# Patient Record
Sex: Female | Born: 1960 | Race: Black or African American | Hispanic: No | Marital: Married | Smoking: Never smoker
Health system: Southern US, Community
[De-identification: ages and names within clinical notes are randomized; demographics above are authoritative.]

---

## 2016-04-23 ENCOUNTER — Ambulatory Visit (INDEPENDENT_AMBULATORY_CARE_PROVIDER_SITE_OTHER): Payer: BLUE CROSS/BLUE SHIELD | Admitting: Physician Assistant

## 2016-04-23 VITALS — BP 120/80 | HR 72 | Temp 97.9°F | Resp 16 | Ht 62.0 in | Wt 191.0 lb

## 2016-04-23 DIAGNOSIS — Z1329 Encounter for screening for other suspected endocrine disorder: Secondary | ICD-10-CM | POA: Diagnosis not present

## 2016-04-23 DIAGNOSIS — Z0000 Encounter for general adult medical examination without abnormal findings: Secondary | ICD-10-CM | POA: Diagnosis not present

## 2016-04-23 DIAGNOSIS — Z13220 Encounter for screening for lipoid disorders: Secondary | ICD-10-CM | POA: Diagnosis not present

## 2016-04-23 DIAGNOSIS — Z130 Encounter for screening for diseases of the blood and blood-forming organs and certain disorders involving the immune mechanism: Secondary | ICD-10-CM

## 2016-04-23 DIAGNOSIS — Z13228 Encounter for screening for other metabolic disorders: Secondary | ICD-10-CM | POA: Diagnosis not present

## 2016-04-23 LAB — COMPLETE METABOLIC PANEL WITH GFR
ALBUMIN: 3.7 g/dL (ref 3.6–5.1)
ALK PHOS: 96 U/L (ref 33–130)
ALT: 20 U/L (ref 6–29)
AST: 30 U/L (ref 10–35)
BILIRUBIN TOTAL: 0.3 mg/dL (ref 0.2–1.2)
BUN: 11 mg/dL (ref 7–25)
CO2: 22 mmol/L (ref 20–31)
CREATININE: 0.55 mg/dL (ref 0.50–1.05)
Calcium: 8.5 mg/dL — ABNORMAL LOW (ref 8.6–10.4)
Chloride: 106 mmol/L (ref 98–110)
GFR, Est African American: >89 mL/min (ref >=60)
GFR, Est Non African American: >89 mL/min (ref >=60)
GLUCOSE: 115 mg/dL — AB (ref 65–99)
Potassium: 4 mmol/L (ref 3.5–5.3)
SODIUM: 138 mmol/L (ref 135–146)
TOTAL PROTEIN: 6.4 g/dL (ref 6.1–8.1)

## 2016-04-23 LAB — CBC
HCT: 31.9 % — ABNORMAL LOW (ref 35.0–45.0)
HEMOGLOBIN: 10.4 g/dL — AB (ref 11.7–15.5)
MCH: 26.3 pg — AB (ref 27.0–33.0)
MCHC: 32.6 g/dL (ref 32.0–36.0)
MCV: 80.8 fL (ref 80.0–100.0)
MPV: 10.3 fL (ref 7.5–12.5)
Platelets: 273 10*3/uL (ref 140–400)
RBC: 3.95 MIL/uL (ref 3.80–5.10)
RDW: 14.6 % (ref 11.0–15.0)
WBC: 2.3 10*3/uL — ABNORMAL LOW (ref 3.8–10.8)

## 2016-04-23 LAB — LIPID PANEL
Cholesterol: 117 mg/dL — ABNORMAL LOW (ref 125–200)
HDL: 60 mg/dL (ref >=46)
LDL CALC: 43 mg/dL (ref <130)
Total CHOL/HDL Ratio: 2 Ratio (ref <=5.0)
Triglycerides: 72 mg/dL (ref <150)
VLDL: 14 mg/dL (ref <30)

## 2016-04-23 LAB — POCT URINALYSIS DIP (MANUAL ENTRY)
Bilirubin, UA: NEGATIVE
Glucose, UA: NEGATIVE
Ketones, POC UA: NEGATIVE
LEUKOCYTES UA: NEGATIVE
NITRITE UA: NEGATIVE
PH UA: 7
PROTEIN UA: NEGATIVE
Spec Grav, UA: 1.02
UROBILINOGEN UA: 0.2

## 2016-04-23 LAB — TSH: TSH: 1.4 m[IU]/L

## 2016-04-23 NOTE — Patient Instructions (Addendum)
Please increase your water intake to 64 oz if not more per day.   I will have your lab results within the next 2 weeks.   Keeping You Healthy  Get These Tests  Blood Pressure- Have your blood pressure checked by your healthcare provider at least once a year.  Normal blood pressure is 120/80.  Weight- Have your body mass index (BMI) calculated to screen for obesity.  BMI is a measure of body fat based on height and weight.  You can calculate your own BMI at https://www.west-esparza.com/  Cholesterol- Have your cholesterol checked every year.  Diabetes- Have your blood sugar checked every year if you have high blood pressure, high cholesterol, a family history of diabetes or if you are overweight.  Pap Test - Have a pap test every 1 to 5 years if you have been sexually active.  If you are older than 65 and recent pap tests have been normal you may not need additional pap tests.  In addition, if you have had a hysterectomy  for benign disease additional pap tests are not necessary.  Mammogram-Yearly mammograms are essential for early detection of breast cancer  Screening for Colon Cancer- Colonoscopy starting at age 71. Screening may begin sooner depending on your family history and other health conditions.  Follow up colonoscopy as directed by your Gastroenterologist.  Screening for Osteoporosis- Screening begins at age 76 with bone density scanning, sooner if you are at higher risk for developing Osteoporosis.  Get these medicines  Calcium with Vitamin D- Your body requires 1200-1500 mg of Calcium a day and [phone] IU of Vitamin D a day.  You can only absorb 500 mg of Calcium at a time therefore Calcium must be taken in 2 or 3 separate doses throughout the day.  Hormones- Hormone therapy has been associated with increased risk for certain cancers and heart disease.  Talk to your healthcare provider about if you need relief from menopausal symptoms.  Aspirin- Ask your healthcare provider about  taking Aspirin to prevent Heart Disease and Stroke.  Get these Immuniztions  Flu shot- Every fall  Pneumonia shot- Once after the age of 71; if you are younger ask your healthcare provider if you need a pneumonia shot.  Tetanus- Every ten years.  Zostavax- Once after the age of 57 to prevent shingles.  Take these steps  Don't smoke- Your healthcare provider can help you quit. For tips on how to quit, ask your healthcare provider or go to www.smokefree.gov or call 1-800 QUIT-NOW.  Be physically active- Exercise 5 days a week for a minimum of 30 minutes.  If you are not already physically active, start slow and gradually work up to 30 minutes of moderate physical activity.  Try walking, dancing, bike riding, swimming, etc.  Eat a healthy diet- Eat a variety of healthy foods such as fruits, vegetables, whole grains, low fat milk, low fat cheeses, yogurt, lean meats, chicken, fish, eggs, dried beans, tofu, etc.  For more information go to www.thenutritionsource.org  Dental visit- Brush and floss teeth twice daily; visit your dentist twice a year.  Eye exam- Visit your Optometrist or Ophthalmologist yearly.  Drink alcohol in moderation- Limit alcohol intake to one drink or less a day.  Never drink and drive.  Depression- Your emotional health is as important as your physical health.  If you're feeling down or losing interest in things you normally enjoy, please talk to your healthcare provider.  Seat Belts- can save your life; always wear one  Smoke/Carbon Monoxide detectors- These detectors need to be installed on the appropriate level of your home.  Replace batteries at least once a year.  Violence- If anyone is threatening or hurting you, please tell your healthcare provider.  Living Will/ Health care power of attorney- Discuss with your healthcare provider and family.   IF you received an x-ray today, you will receive an invoice from [facility]. Please contact [facility] at [phone] with questions or concerns regarding your invoice.   IF you received labwork today, you will receive an invoice from [facility]. Please contact [facility] at [phone] with questions or concerns regarding your invoice.   Our billing staff will not be able to assist you with questions regarding bills from these companies.  You will be contacted with the lab results as soon as they are available. The fastest way to get your results is to activate your My Chart account. Instructions are located on the last page of this paperwork. If you have not heard from us regarding the results in 2 weeks, please contact this office.

## 2016-04-23 NOTE — Progress Notes (Signed)
[facility] [address], [place] [id number] [phone]  Date:  04/23/2016   Name:  Patty KinsmanKaren Armstrong   DOB:  Oct 04, 1960   MRN:  [id number]  PCP:  No PCP Per Patient    History of Present Illness:  Patty KinsmanKaren Cull is a 55 y.o. female patient who [facility].    Diet: vegetarian for 2 years--eating a lot of vegetables, tries to eat nuts.  Rarely fish.  Water intake is about twice per week.  Rare soda.  Drinks a lot of hot tea.   BM: no constipation or diarrhea.  No blood in stool or black stool  Urination: no pain, hematuria--she does have some urgency.    Sleep: No difficulty with sleep, she gets about 5-6 hours due to work.  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    EtOH: rare during special events Tobacco: tried once Illicit drug use: none  There are no active problems to display for this patient.   History reviewed. No pertinent past medical history.  History reviewed. No pertinent surgical history.  Social History  Substance Use Topics  . Smoking status: Never Smoker  . Smokeless tobacco: Never Used  . Alcohol use No    Family History  Problem Relation Age of Onset  . Diabetes Mother   . Heart disease Mother   . Hypertension Mother     No Known Allergies  Medication list has been reviewed and updated.  No current outpatient prescriptions on file prior to visit.   No current facility-administered medications on file prior to visit.     Review of Systems  Constitutional: Negative for chills and fever.  HENT: Negative for ear discharge, ear pain and sore throat.   Eyes: Negative for blurred vision and double vision.  Respiratory: Negative for cough, shortness of breath and wheezing.   Cardiovascular: Negative for chest pain, palpitations and leg swelling.  Gastrointestinal: Negative for diarrhea, nausea and vomiting.  Genitourinary: Negative for dysuria, frequency and hematuria.  Skin:  Negative for itching and rash.  Neurological: Negative for dizziness and headaches.   Physical Examination: BP 120/80 (BP Location: Right Arm, Patient Position: Sitting, Cuff Size: Normal)   Pulse 72   Temp 97.9 F (36.6 C) (Oral)   Resp 16   Ht 5\' 2"  (1.575 m)   Wt 191 lb (86.6 kg)   LMP 04/20/2016 (Approximate)   SpO2 100%   BMI 34.93 kg/m  Ideal Body Weight: Weight in (lb) to have BMI = 25: 136.4  Physical Exam  Constitutional: She is oriented to person, place, and time. She appears well-developed and well-nourished. No distress.  HENT:  Head: Normocephalic and atraumatic.  Right Ear: Tympanic membrane, external ear and ear canal normal.  Left Ear: Tympanic membrane, external ear and ear canal normal.  Nose: Right sinus exhibits no maxillary sinus tenderness and no frontal sinus tenderness. Left sinus exhibits no maxillary sinus tenderness and no frontal sinus tenderness.  Mouth/Throat: Oropharynx is clear and moist. No uvula swelling. No oropharyngeal exudate, posterior oropharyngeal edema or posterior oropharyngeal erythema.  Eyes: Conjunctivae and EOM are normal. Pupils are equal, round, and reactive to light.  Neck: Normal range of motion. Neck supple. No thyromegaly present.  Cardiovascular: Normal rate, regular rhythm, normal heart sounds and intact distal pulses.  Exam reveals no gallop, no distant heart sounds and no friction rub.   No murmur heard. Pulmonary/Chest: Effort normal and breath sounds  normal. No respiratory distress. She has no decreased breath sounds. She has no wheezes. She has no rhonchi.  Abdominal: Soft. Bowel sounds are normal. She exhibits no distension and no mass. There is no tenderness.  Musculoskeletal: Normal range of motion. She exhibits no edema or tenderness.  Lymphadenopathy:       Head (right side): No submandibular, no tonsillar, no preauricular and no posterior auricular adenopathy present.       Head (left side): No submandibular, no  tonsillar, no preauricular and no posterior auricular adenopathy present.    She has no cervical adenopathy.  Neurological: She is alert and oriented to person, place, and time. No cranial nerve deficit. She exhibits normal muscle tone. Coordination normal.  Skin: Skin is warm and dry. She is not diaphoretic.  Psychiatric: She has a normal mood and affect. Her behavior is normal.     Assessment and Plan: Patty Armstrong is a 54 y.o. female who is here today for annual physical exam. Annual physical exam - Plan: CBC, COMPLETE METABOLIC PANEL WITH GFR, Lipid panel, TSH, POCT urinalysis dipstick  Screening for deficiency anemia - Plan: CBC  Screening for lipid disorders - Plan: Lipid panel  Screening for thyroid disorder - Plan: TSH  Screening for metabolic disorder - Plan: COMPLETE METABOLIC PANEL WITH GFR, POCT urinalysis dipstick  Trena Platt, PA-C [facility] 04/23/2016 8:57 AM

## 2016-04-26 ENCOUNTER — Telehealth: Payer: Self-pay

## 2016-04-26 NOTE — Telephone Encounter (Signed)
Correct cell phone number is [phone]

## 2016-04-26 NOTE — Telephone Encounter (Signed)
Left message to call back, does she need a letter just stating she was here or something more detailed about the px?

## 2016-04-26 NOTE — Telephone Encounter (Addendum)
Patty Armstrong   Patient needs a letter stating she had a physical last week.    Patient will  Pick up the letter.    [phone]

## 2016-04-27 NOTE — Telephone Encounter (Signed)
Spoke with pt and she stated she came by today and picked up appropriate paperwork.

## 2016-04-27 NOTE — Telephone Encounter (Signed)
Patient called back stating that she needs a letter saying that she is in good health and cleared to work, as well as her vitals from her CPE.

## 2017-08-29 ENCOUNTER — Emergency Department
Admission: EM | Admit: 2017-08-29 | Discharge: 2017-08-29 | Disposition: A | Discharge status: Home / Self Care | Payer: BLUE CROSS/BLUE SHIELD | Attending: Emergency Medicine | Admitting: Emergency Medicine

## 2017-08-29 ENCOUNTER — Other Ambulatory Visit: Payer: Self-pay

## 2017-08-29 DIAGNOSIS — R51 Headache: Secondary | ICD-10-CM | POA: Diagnosis not present

## 2017-08-29 DIAGNOSIS — R519 Headache, unspecified: Secondary | ICD-10-CM

## 2017-08-29 MED ORDER — BUTALBITAL-APAP-CAFFEINE 50-325-40 MG PO TABS: 1.0000 | ORAL_TABLET | Freq: Four times a day (QID) | ORAL | 0 refills | Status: AC | PRN

## 2017-08-29 MED ORDER — BUTALBITAL-APAP-CAFFEINE 50-325-40 MG PO TABS
2.0000 | ORAL_TABLET | Freq: Once | ORAL | Status: AC
  Administered 2017-08-29: 2 via ORAL
  Filled 2017-08-29: qty 2

## 2017-08-29 NOTE — ED Triage Notes (Signed)
Pt states left sided headache for 2 days. Pt states is taking tylenol at home without improvement in pain. Pt denies denies vomting, fever, photophobia. Pt ambulatory and appears in no acute distress.

## 2017-08-29 NOTE — ED Notes (Signed)
Pt updated on delay. Pt denies needs at this time.

## 2017-08-29 NOTE — ED Notes (Signed)
Pt c/o having sinus congestion with no noted drainage.

## 2017-08-29 NOTE — ED Provider Notes (Signed)
[facility] Emergency Department Provider Note       Time seen: ----------------------------------------- 8:36 AM on 08/29/2017 -----------------------------------------   I have reviewed the triage vital signs and the nursing notes.  HISTORY   Chief Complaint Headache    HPI Patty Armstrong is a 57 y.o. female with no significant past medical history who presents to the ED for a left temporal headache for the past 2 days.  Patient states she is taking Tylenol at home without any improvement.  She denies fever, vomiting or photophobia.  She reports being under a lot of stress about her husband who has cancer.  She has had some upper respiratory symptoms recently.  Pain is currently mild in the left temple.  No past medical history on file.  There are no active problems to display for this patient.   No past surgical history on file.  Allergies Patient has no known allergies.  Social History Social History   Tobacco Use  . Smoking status: Never Smoker  . Smokeless tobacco: Never Used  Substance Use Topics  . Alcohol use: No  . Drug use: No    Review of Systems Constitutional: Negative for fever. Cardiovascular: Negative for chest pain. Respiratory: Negative for shortness of breath. Gastrointestinal: Negative for abdominal pain, vomiting and diarrhea. Genitourinary: Negative for dysuria. Musculoskeletal: Negative for back pain. Skin: Negative for rash. Neurological: Positive for headache  All systems negative/normal/unremarkable except as stated in the HPI  ____________________________________________   PHYSICAL EXAM:  VITAL SIGNS: ED Triage Vitals  Enc Vitals Group     BP 08/29/17 0217 (!) 160/84     Pulse Rate 08/29/17 0354 73     Resp 08/29/17 0217 18     Temp 08/29/17 0217 98.1 F (36.7 C)     Temp Source 08/29/17 0217 Oral     SpO2 08/29/17 0217 100 %     Weight 08/29/17 0110 190 lb (86.2 kg)     Height 08/29/17 0110 5\' 1"   (1.549 m)     Head Circumference --      Peak Flow --      Pain Score 08/29/17 0110 10     Pain Loc --      Pain Edu? --      Excl. in GC? --     Constitutional: Alert and oriented. Well appearing and in no distress. Eyes: Conjunctivae are normal. Normal extraocular movements. ENT   Head: Normocephalic and atraumatic.   Nose: No congestion/rhinnorhea.   Mouth/Throat: Mucous membranes are moist.   Neck: No stridor. Cardiovascular: Normal rate, regular rhythm. No murmurs, rubs, or gallops. Respiratory: Normal respiratory effort without tachypnea nor retractions. Breath sounds are clear and equal bilaterally. No wheezes/rales/rhonchi. Gastrointestinal: Soft and nontender. Normal bowel sounds Musculoskeletal: Nontender with normal range of motion in extremities. No lower extremity tenderness nor edema. Neurologic:  Normal speech and language. No gross focal neurologic deficits are appreciated.  Skin:  Skin is warm, dry and intact. No rash noted. Psychiatric: Mood and affect are normal. Speech and behavior are normal.  ____________________________________________  ED COURSE:  As part of my medical decision making, I reviewed the following data within the electronic MEDICAL RECORD NUMBER History obtained from family if available, nursing notes, old chart and ekg, as well as notes from prior ED visits. Patient presented for headache, she has a normal neurologic exam and overall appears well.   Procedures  ____________________________________________  DIFFERENTIAL DIAGNOSIS   Tension headache, migraine, cluster headache, sinusitis, subarachnoid hemorrhage  FINAL ASSESSMENT AND PLAN  Headache   Plan: Patient had presented for headache.  Clinically she appears well and does not require further treatment at this time.  I have written for some Fioricet but overall she is cleared for outpatient follow-up with her doctor.   Ulice Dash, MD   Note: This note was  generated in part or whole with voice recognition software. Voice recognition is usually quite accurate but there are transcription errors that can and very often do occur. I apologize for any typographical errors that were not detected and corrected.     Emily Filbert, MD 08/29/17 [phone]

## 2018-05-18 ENCOUNTER — Other Ambulatory Visit: Payer: Self-pay | Admitting: Physician Assistant

## 2018-05-18 DIAGNOSIS — Z1231 Encounter for screening mammogram for malignant neoplasm of breast: Secondary | ICD-10-CM

## 2018-07-04 ENCOUNTER — Ambulatory Visit
Admission: RE | Admit: 2018-07-04 | Discharge: 2018-07-04 | Disposition: A | Discharge status: Home / Self Care | Payer: BLUE CROSS/BLUE SHIELD | Source: Ambulatory Visit | Attending: Physician Assistant | Admitting: Physician Assistant

## 2018-07-04 DIAGNOSIS — Z1231 Encounter for screening mammogram for malignant neoplasm of breast: Secondary | ICD-10-CM

## 2019-09-06 ENCOUNTER — Encounter: Payer: BLUE CROSS/BLUE SHIELD | Admitting: Obstetrics and Gynecology

## 2019-09-12 ENCOUNTER — Encounter: Payer: Self-pay | Admitting: Obstetrics and Gynecology

## 2019-09-12 ENCOUNTER — Ambulatory Visit (INDEPENDENT_AMBULATORY_CARE_PROVIDER_SITE_OTHER): Payer: 59 | Admitting: Obstetrics and Gynecology

## 2019-09-12 ENCOUNTER — Other Ambulatory Visit: Payer: Self-pay

## 2019-09-12 VITALS — BP 148/92 | HR 75 | Ht 61.5 in | Wt 167.1 lb

## 2019-09-12 DIAGNOSIS — Z1231 Encounter for screening mammogram for malignant neoplasm of breast: Secondary | ICD-10-CM

## 2019-09-12 DIAGNOSIS — N914 Secondary oligomenorrhea: Secondary | ICD-10-CM | POA: Diagnosis not present

## 2019-09-12 DIAGNOSIS — Z01419 Encounter for gynecological examination (general) (routine) without abnormal findings: Secondary | ICD-10-CM | POA: Diagnosis not present

## 2019-09-12 NOTE — Progress Notes (Signed)
HPI:      [name] is a 59 y.o. No obstetric history on file. who LMP was No LMP recorded (lmp unknown).  Subjective:   She presents today for her annual examination.  She states that she is having approximately 3-4 menses per year and has been doing that for greater than 3 years.  She says she thinks she is in "perimenopause".  She does complain of some cramping and heavy bleeding the few times that she does have a menses. She reports that she has a normal Pap smear 1 year ago and has never had an abnormal. She gets yearly mammography and says there has never been an issue. She denies any issues with hypertension.    Hx: The following portions of the patient's history were reviewed and updated as appropriate:             She  has no past medical history on file. She does not have a problem list on file. She  has no past surgical history on file. Her family history includes Diabetes in her mother; Heart disease in her mother; Hypertension in her mother. She  reports that she has never smoked. She has never used smokeless tobacco. She reports that she does not drink alcohol or use drugs. She currently has no medications in their medication list. She has No Known Allergies.       Review of Systems:  Review of Systems  Constitutional: Denied constitutional symptoms, night sweats, recent illness, fatigue, fever, insomnia and weight loss.  Eyes: Denied eye symptoms, eye pain, photophobia, vision change and visual disturbance.  Ears/Nose/Throat/Neck: Denied ear, nose, throat or neck symptoms, hearing loss, nasal discharge, sinus congestion and sore throat.  Cardiovascular: Denied cardiovascular symptoms, arrhythmia, chest pain/pressure, edema, exercise intolerance, orthopnea and palpitations.  Respiratory: Denied pulmonary symptoms, asthma, pleuritic pain, productive sputum, cough, dyspnea and wheezing.  Gastrointestinal: Denied, gastro-esophageal reflux, melena, nausea and vomiting.   Genitourinary: Denied genitourinary symptoms including symptomatic vaginal discharge, pelvic relaxation issues, and urinary complaints.  Musculoskeletal: Denied musculoskeletal symptoms, stiffness, swelling, muscle weakness and myalgia.  Dermatologic: Denied dermatology symptoms, rash and scar.  Neurologic: Denied neurology symptoms, dizziness, headache, neck pain and syncope.  Psychiatric: Denied psychiatric symptoms, anxiety and depression.  Endocrine: Denied endocrine symptoms including hot flashes and night sweats.   Meds:   No current outpatient medications on file prior to visit.   No current facility-administered medications on file prior to visit.    Objective:     Vitals:   09/12/19 0826  BP: (!) 148/92  Pulse: 75              Physical examination General NAD, Conversant  HEENT Atraumatic; Op clear with mmm.  Normo-cephalic. Pupils reactive. Anicteric sclerae  Thyroid/Neck Smooth without nodularity or enlargement. Normal ROM.  Neck Supple.  Skin No rashes, lesions or ulceration. Normal palpated skin turgor. No nodularity.  Breasts: No masses or discharge.  Symmetric.  No axillary adenopathy.  Lungs: Clear to auscultation.No rales or wheezes. Normal Respiratory effort, no retractions.  Heart: NSR.  No murmurs or rubs appreciated. No periferal edema  Abdomen: Soft.  Non-tender.  No masses.  No HSM. No hernia  Extremities: Moves all appropriately.  Normal ROM for age. No lymphadenopathy.  Neuro: Oriented to PPT.  Normal mood. Normal affect.     Pelvic:   Vulva: Normal appearance.  No lesions.  Vagina: No lesions or abnormalities noted.  Support: Normal pelvic support.  Urethra No masses tenderness or  scarring.  Meatus Normal size without lesions or prolapse.  Cervix: Normal appearance.  No lesions.  Anus: Normal exam.  No lesions.  Perineum: Normal exam.  No lesions.        Bimanual   Uterus:  10 weeks size mildly tender mobile.  Mid position  Adnexae: No masses.   Non-tender to palpation.  Cul-de-sac: Negative for abnormality.      Assessment:    No obstetric history on file. There are no problems to display for this patient.    1. Encounter for screening mammogram for malignant neoplasm of breast   2. Well woman exam with routine gynecological exam   3. Secondary oligomenorrhea     Patient may have uterine enlargement secondary to small fibroids.  This would be consistent with her cramping and heavy bleeding when she does have a menstrual period.   Plan:            1.  Basic Screening Recommendations The basic screening recommendations for asymptomatic women were discussed with the patient during her visit.  The age-appropriate recommendations were discussed with her and the rational for the tests reviewed.  When I am informed by the patient that another primary care physician has previously obtained the age-appropriate tests and they are up-to-date, only outstanding tests are ordered and referrals given as necessary.  Abnormal results of tests will be discussed with her when all of her results are completed.  Routine preventative health maintenance measures emphasized: Exercise/Diet/Weight control, Tobacco Warnings, Alcohol/Substance use risks and Stress Management Mammogram-blood work 2.  Will add FSH to blood work to make sure that her bleeding is not postmenopausal.  If it turns out her [facility] is elevated and she continues to bleed she will need a work-up for postmenopausal bleeding. 3.  Possible uterine fibroids.  If bleeding becomes a further issue consider ultrasound for delineation of size and location of fibroids. Orders Orders Placed This Encounter  Procedures  . MM 3D SCREEN BREAST BILATERAL  . Lipid panel  . Hemoglobin A1c  . TSH  . Follicle stimulating hormone    No orders of the defined types were placed in this encounter.       F/U  Return in about 1 year (around 09/11/2020) for Annual Physical.  Elonda Husky,  M.D. 09/12/2019 8:58 AM

## 2019-09-13 LAB — FOLLICLE STIMULATING HORMONE: FSH: 80.5 m[IU]/mL

## 2019-09-13 LAB — LIPID PANEL
Chol/HDL Ratio: 1.8 ratio (ref 0.0–4.4)
Cholesterol, Total: 126 mg/dL (ref 100–199)
HDL: 72 mg/dL (ref >39)
LDL Chol Calc (NIH): 39 mg/dL (ref 0–99)
Triglycerides: 73 mg/dL (ref 0–149)
VLDL Cholesterol Cal: 15 mg/dL (ref 5–40)

## 2019-09-13 LAB — HEMOGLOBIN A1C
Est. average glucose Bld gHb Est-mCnc: 137 mg/dL
Hgb A1c MFr Bld: 6.4 % — ABNORMAL HIGH (ref 4.8–5.6)

## 2019-09-13 LAB — TSH: TSH: 2.04 u[IU]/mL (ref 0.450–4.500)

## 2019-12-13 ENCOUNTER — Encounter (INDEPENDENT_AMBULATORY_CARE_PROVIDER_SITE_OTHER): Payer: Self-pay | Admitting: Primary Care

## 2019-12-13 ENCOUNTER — Other Ambulatory Visit: Payer: Self-pay

## 2019-12-13 ENCOUNTER — Telehealth (INDEPENDENT_AMBULATORY_CARE_PROVIDER_SITE_OTHER): Payer: Self-pay

## 2019-12-13 ENCOUNTER — Telehealth (INDEPENDENT_AMBULATORY_CARE_PROVIDER_SITE_OTHER): Payer: 59 | Admitting: Primary Care

## 2019-12-13 DIAGNOSIS — R7303 Prediabetes: Secondary | ICD-10-CM

## 2019-12-13 DIAGNOSIS — Z7689 Persons encountering health services in other specified circumstances: Secondary | ICD-10-CM | POA: Diagnosis not present

## 2019-12-13 NOTE — Telephone Encounter (Signed)
I connected with  Patty Armstrong on 12/13/19 by a video enabled telemedicine application and verified that I am speaking with the correct person using two identifiers.   I discussed the limitations of evaluation and management by telemedicine. The patient expressed understanding and agreed to proceed.  Maryjean Morn, CMA

## 2019-12-13 NOTE — Telephone Encounter (Signed)
Contacted patient and advised her to call [facility] to schedule. If there is an issue with the order contact ordering provider which is her GYN. She verbalized understanding.

## 2019-12-13 NOTE — Progress Notes (Signed)
Virtual Visit via Telephone Note  I connected with Patty Armstrong on 12/13/19 at  8:50 AM EDT by telephone and verified that I am speaking with the correct person using two identifiers.   I discussed the limitations, risks, security and privacy concerns of performing an evaluation and management service by telephone and the availability of in person appointments. I also discussed with the patient that there may be a patient responsible charge related to this service. The patient expressed understanding and agreed to proceed.   History of Present Illness: Patty Armstrong is a 59 year old African American female having a tele visit to establish care. Her only concern is her job is requiring her to have an physical.  No past medical history on file.  No current outpatient medications on file prior to visit.   No current facility-administered medications on file prior to visit.   Observations/Objective: Review of Systems  All other systems reviewed and are negative.  Assessment and Plan: Teri was seen today for new patient (initial visit).  Diagnoses and all orders for this visit:  Encounter to establish care Gwinda Passe, NP-C will be your  (PCP) she is mastered prepared . She is skilled to diagnosed and treat illness. Also able to answer health concern as well as continuing care of varied medical conditions, not limited by cause, organ system, or diagnosis.   Prediabetes A1C in February 6.4 discussed decreasing foods that are high in carbohydrates are the following rice, potatoes, breads, sugars, and pastas.  Reduction in the intake (eating) will assist in lowering your blood sugars.  Follow Up Instructions:    I discussed the assessment and treatment plan with the patient. The patient was provided an opportunity to ask questions and all were answered. The patient agreed with the plan and demonstrated an understanding of the instructions.   The patient was advised to call back or  seek an in-person evaluation if the symptoms worsen or if the condition fails to improve as anticipated.  I provided 12 minutes of non-face-to-face time during this encounter.  Review of previous encounters, labs, last imaging mammogram will need one this year. Recc. yearly   Grayce Sessions, NP

## 2019-12-13 NOTE — Telephone Encounter (Signed)
Patient called requesting for her mammograph order to be sent to [facility] patient states they are located in [place].  Please advice patient [phone]

## 2019-12-31 ENCOUNTER — Ambulatory Visit
Admission: RE | Admit: 2019-12-31 | Discharge: 2019-12-31 | Disposition: A | Discharge status: Home / Self Care | Payer: 59 | Source: Ambulatory Visit | Attending: Obstetrics and Gynecology | Admitting: Obstetrics and Gynecology

## 2019-12-31 ENCOUNTER — Other Ambulatory Visit: Payer: Self-pay

## 2019-12-31 DIAGNOSIS — Z1231 Encounter for screening mammogram for malignant neoplasm of breast: Secondary | ICD-10-CM

## 2020-01-31 ENCOUNTER — Ambulatory Visit (INDEPENDENT_AMBULATORY_CARE_PROVIDER_SITE_OTHER): Payer: 59 | Admitting: Primary Care

## 2020-01-31 ENCOUNTER — Encounter (INDEPENDENT_AMBULATORY_CARE_PROVIDER_SITE_OTHER): Payer: Self-pay | Admitting: Primary Care

## 2020-01-31 ENCOUNTER — Other Ambulatory Visit: Payer: Self-pay

## 2020-01-31 VITALS — BP 160/92 | HR 72 | Temp 98.3°F | Ht 61.0 in | Wt 161.8 lb

## 2020-01-31 DIAGNOSIS — F411 Generalized anxiety disorder: Secondary | ICD-10-CM

## 2020-01-31 DIAGNOSIS — R030 Elevated blood-pressure reading, without diagnosis of hypertension: Secondary | ICD-10-CM | POA: Diagnosis not present

## 2020-01-31 DIAGNOSIS — Z0283 Encounter for blood-alcohol and blood-drug test: Secondary | ICD-10-CM

## 2020-01-31 DIAGNOSIS — R7303 Prediabetes: Secondary | ICD-10-CM

## 2020-01-31 DIAGNOSIS — Z1159 Encounter for screening for other viral diseases: Secondary | ICD-10-CM

## 2020-01-31 LAB — POCT GLYCOSYLATED HEMOGLOBIN (HGB A1C): Hemoglobin A1C: 5.9 % — AB (ref 4.0–5.6)

## 2020-01-31 MED ORDER — AMLODIPINE BESYLATE 10 MG PO TABS: 10.0000 mg | ORAL_TABLET | Freq: Every day | ORAL | 3 refills | Status: AC

## 2020-01-31 NOTE — Patient Instructions (Signed)
Hypertension  Hypertension, Adult Hypertension is another name for high blood pressure. High blood pressure forces your heart to work harder to pump blood. This can cause problems over time. There are two numbers in a blood pressure reading. There is a top number (systolic) over a bottom number (diastolic). It is best to have a blood pressure that is below 120/80. Healthy choices can help lower your blood pressure, or you may need medicine to help lower it. What are the causes? The cause of this condition is not known. Some conditions may be related to high blood pressure. What increases the risk?  Smoking.  Having type 2 diabetes mellitus, high cholesterol, or both.  Not getting enough exercise or physical activity.  Being overweight.  Having too much fat, sugar, calories, or salt (sodium) in your diet.  Drinking too much alcohol.  Having long-term (chronic) kidney disease.  Having a family history of high blood pressure.  Age. Risk increases with age.  Race. You may be at higher risk if you are African American.  Gender. Men are at higher risk than women before age 63. After age 95, women are at higher risk than men.  Having obstructive sleep apnea.  Stress. What are the signs or symptoms?  High blood pressure may not cause symptoms. Very high blood pressure (hypertensive crisis) may cause: ? Headache. ? Feelings of worry or nervousness (anxiety). ? Shortness of breath. ? Nosebleed. ? A feeling of being sick to your stomach (nausea). ? Throwing up (vomiting). ? Changes in how you see. ? Very bad chest pain. ? Seizures. How is this treated?  This condition is treated by making healthy lifestyle changes, such as: ? Eating healthy foods. ? Exercising more. ? Drinking less alcohol.  Your health care provider may prescribe medicine if lifestyle changes are not enough to get your blood pressure under control, and if: ? Your top number is above 130. ? Your bottom  number is above 80.  Your personal target blood pressure may vary. Follow these instructions at home: Eating and drinking   If told, follow the DASH eating plan. To follow this plan: ? Fill one half of your plate at each meal with fruits and vegetables. ? Fill one fourth of your plate at each meal with whole grains. Whole grains include whole-wheat pasta, brown rice, and whole-grain bread. ? Eat or drink low-fat dairy products, such as skim milk or low-fat yogurt. ? Fill one fourth of your plate at each meal with low-fat (lean) proteins. Low-fat proteins include fish, chicken without skin, eggs, beans, and tofu. ? Avoid fatty meat, cured and processed meat, or chicken with skin. ? Avoid pre-made or processed food.  Eat less than 1,500 mg of salt each day.  Do not drink alcohol if: ? Your doctor tells you not to drink. ? You are pregnant, may be pregnant, or are planning to become pregnant.  If you drink alcohol: ? Limit how much you use to:  0-1 drink a day for women.  0-2 drinks a day for men. ? Be aware of how much alcohol is in your drink. In the [place] one drink equals one 12 oz bottle of beer (355 mL), one 5 oz glass of wine (148 mL), or one 1 oz glass of hard liquor (44 mL). Lifestyle   Work with your doctor to stay at a healthy weight or to lose weight. Ask your doctor what the best weight is for you.  Get at least 30 minutes of  exercise most days of the week. This may include walking, swimming, or biking.  Get at least 30 minutes of exercise that strengthens your muscles (resistance exercise) at least 3 days a week. This may include lifting weights or doing Pilates.  Do not use any products that contain nicotine or tobacco, such as cigarettes, e-cigarettes, and chewing tobacco. If you need help quitting, ask your doctor.  Check your blood pressure at home as told by your doctor.  Keep all follow-up visits as told by your doctor. This is important. Medicines  Take  over-the-counter and prescription medicines only as told by your doctor. Follow directions carefully.  Do not skip doses of blood pressure medicine. The medicine does not work as well if you skip doses. Skipping doses also puts you at risk for problems.  Ask your doctor about side effects or reactions to medicines that you should watch for. Contact a doctor if you:  Think you are having a reaction to the medicine you are taking.  Have headaches that keep coming back (recurring).  Feel dizzy.  Have swelling in your ankles.  Have trouble with your vision. Get help right away if you:  Get a very bad headache.  Start to feel mixed up (confused).  Feel weak or numb.  Feel faint.  Have very bad pain in your: ? Chest. ? Belly (abdomen).  Throw up more than once.  Have trouble breathing. Summary  Hypertension is another name for high blood pressure.  High blood pressure forces your heart to work harder to pump blood.  For most people, a normal blood pressure is less than 120/80.  Making healthy choices can help lower blood pressure. If your blood pressure does not get lower with healthy choices, you may need to take medicine. This information is not intended to replace advice given to you by your health care provider. Make sure you discuss any questions you have with your health care provider. Document Revised: 03/22/2018 Document Reviewed: 03/22/2018 Elsevier Patient Education  2020 [employer].

## 2020-01-31 NOTE — Progress Notes (Signed)
mmr   Established Patient Office Visit  Subjective:  Patient ID: Patty Armstrong, female    DOB: [birth year]  Age: 59 y.o. MRN: [id number]  CC: No chief complaint on file.   HPI [name] is a 59 year old female who presents for establishment of care and elevated blood pressure without diagnosis of hypertension. She denies shortness of breath, headaches, chest pain or lower extremity edema  No past medical history on file.  No past surgical history on file.  Family History  Problem Relation Age of Onset   Diabetes Mother    Heart disease Mother    Hypertension Mother     Social History   Socioeconomic History   Marital status: Married    Spouse name: Not on file   Number of children: Not on file   Years of education: Not on file   Highest education level: Not on file  Occupational History   Not on file  Tobacco Use   Smoking status: Never Smoker   Smokeless tobacco: Never Used  Substance and Sexual Activity   Alcohol use: No   Drug use: No   Sexual activity: Not on file  Other Topics Concern   Not on file  Social History Narrative   Not on file   Social Determinants of Health   Financial Resource Strain:    Difficulty of Paying Living Expenses:   Food Insecurity:    Worried About Charity fundraiser in the Last Year:    Arboriculturist in the Last Year:   Transportation Needs:    Film/video editor (Medical):    Lack of Transportation (Non-Medical):   Physical Activity:    Days of Exercise per Week:    Minutes of Exercise per Session:   Stress:    Feeling of Stress :   Social Connections:    Frequency of Communication with Friends and Family:    Frequency of Social Gatherings with Friends and Family:    Attends Religious Services:    Active Member of Clubs or Organizations:    Attends Archivist Meetings:    Marital Status:   Intimate Partner Violence:    Fear of Current or Ex-Partner:    Emotionally  Abused:    Physically Abused:    Sexually Abused:     No outpatient medications prior to visit.   No facility-administered medications prior to visit.    No Known Allergies  ROS Review of Systems  Psychiatric/Behavioral: The patient is nervous/anxious.   All other systems reviewed and are negative.     Objective:    Physical Exam Vitals reviewed.  Constitutional:      Appearance: She is obese.  HENT:     Head: Normocephalic.     Right Ear: Tympanic membrane normal.     Left Ear: Tympanic membrane normal.     Nose: Nose normal.  Eyes:     Extraocular Movements: Extraocular movements intact.     Pupils: Pupils are equal, round, and reactive to light.  Cardiovascular:     Rate and Rhythm: Normal rate and regular rhythm.     Pulses: Normal pulses.     Heart sounds: Normal heart sounds.  Pulmonary:     Effort: Pulmonary effort is normal.     Breath sounds: Normal breath sounds.  Abdominal:     General: Bowel sounds are normal.  Musculoskeletal:        General: Normal range of motion.     Cervical  back: Normal range of motion and neck supple.  Skin:    General: Skin is warm and dry.  Neurological:     Mental Status: She is alert and oriented to person, place, and time.  Psychiatric:        Mood and Affect: Mood normal.        Behavior: Behavior normal.        Thought Content: Thought content normal.        Judgment: Judgment normal.     BP (!) 160/92 (BP Location: Left Arm)    Pulse 72    Temp 98.3 F (36.8 C) (Oral)    Ht '5\' 1"'  (1.549 m)    Wt 161 lb 12.8 oz (73.4 kg)    LMP  (LMP Unknown)    SpO2 98%    BMI 30.57 kg/m  Wt Readings from Last 3 Encounters:  01/31/20 161 lb 12.8 oz (73.4 kg)  09/12/19 167 lb 1.6 oz (75.8 kg)  08/29/17 190 lb (86.2 kg)     Health Maintenance Due  Topic Date Due   Hepatitis C Screening  Never done   URINE MICROALBUMIN  Never done   COVID-19 Vaccine (1) Never done   HIV Screening  Never done   PAP SMEAR-Modifier   Never done    There are no preventive care reminders to display for this patient.  Lab Results  Component Value Date   TSH 2.040 09/12/2019   Lab Results  Component Value Date   WBC 2.3 (L) 04/23/2016   HGB 10.4 (L) 04/23/2016   HCT 31.9 (L) 04/23/2016   MCV 80.8 04/23/2016   PLT 273 04/23/2016   Lab Results  Component Value Date   NA 138 04/23/2016   K 4.0 04/23/2016   CO2 22 04/23/2016   GLUCOSE 115 (H) 04/23/2016   BUN 11 04/23/2016   CREATININE 0.55 04/23/2016   BILITOT 0.3 04/23/2016   ALKPHOS 96 04/23/2016   AST 30 04/23/2016   ALT 20 04/23/2016   PROT 6.4 04/23/2016   ALBUMIN 3.7 04/23/2016   CALCIUM 8.5 (L) 04/23/2016   Lab Results  Component Value Date   CHOL 126 09/12/2019   Lab Results  Component Value Date   HDL 72 09/12/2019   Lab Results  Component Value Date   LDLCALC 39 09/12/2019   Lab Results  Component Value Date   TRIG 73 09/12/2019   Lab Results  Component Value Date   CHOLHDL 1.8 09/12/2019   Lab Results  Component Value Date   HGBA1C 5.9 (A) 01/31/2020      Assessment & Plan:  Diagnoses and all orders for this visit:  Prediabetes -     HgB A1c 5.7   Elevated blood pressure reading without diagnosis of hypertension Counseled on blood pressure goal of less than 130/80, low-sodium, DASH diet, medication compliance, 150 minutes of moderate intensity exercise per WEEK. Lifestyle modification   Class 2 severe obesity due to excess calories with serious comorbidity in adult, unspecified BMI ([place]) Obesity is 30-39 indicating an excess in caloric intake or underlining conditions. This may lead to other co-morbidities. T2D, HTN and respiratory complications Lifestyle modifications of diet and exercise may reduce obesity.   Generalized anxiety disorder  We discussed options for treatment of anxiety including therapy and/or medication.  Instructed patient to contact office or on-call physician promptly should condition worsen or  any new symptoms appear.    She was agreeable with this plan.   Need for hepatitis C screening test  health maintaince  gap  Employment-related drug testing, encounter for -     Drug Screen, Rx+Alc+Salicylates, Bld    Follow-up: Return in about 4 weeks (around 02/28/2020) for BP re check .    Kerin Perna, NP

## 2020-02-01 ENCOUNTER — Telehealth (INDEPENDENT_AMBULATORY_CARE_PROVIDER_SITE_OTHER): Payer: Self-pay

## 2020-02-01 LAB — MEASLES/MUMPS/RUBELLA IMMUNITY
MUMPS ABS, IGG: 166 AU/mL (ref >10.9)
RUBEOLA AB, IGG: >300 AU/mL (ref >16.4)
Rubella Antibodies, IGG: 4.35 index (ref >0.99)

## 2020-02-01 LAB — DRUG SCREEN 764883 11+OXYCO+ALC+CRT-BUND
Amphetamines, Urine: NEGATIVE ng/mL
BENZODIAZ UR QL: NEGATIVE ng/mL
Barbiturate: NEGATIVE ng/mL
Cannabinoid Quant, Ur: NEGATIVE ng/mL
Cocaine (Metabolite): NEGATIVE ng/mL
Creatinine: 255 mg/dL (ref 20.0–300.0)
Ethanol: NEGATIVE %
Meperidine: NEGATIVE ng/mL
Methadone Screen, Urine: NEGATIVE ng/mL
OPIATE SCREEN URINE: NEGATIVE ng/mL
Oxycodone/Oxymorphone, Urine: NEGATIVE ng/mL
Phencyclidine: NEGATIVE ng/mL
Propoxyphene: NEGATIVE ng/mL
Tramadol: NEGATIVE ng/mL
pH, Urine: 5.4 (ref 4.5–8.9)

## 2020-02-01 NOTE — Telephone Encounter (Signed)
Patient aware that not all results are available and when they are she will see them in her mychart and possibly receive a phone call. Maryjean Morn, CMA

## 2020-02-03 LAB — QUANTIFERON-TB GOLD PLUS
QuantiFERON Mitogen Value: 7.66 IU/mL
QuantiFERON Nil Value: 0.54 IU/mL
QuantiFERON TB1 Ag Value: 2.37 IU/mL
QuantiFERON TB2 Ag Value: 2.22 IU/mL
QuantiFERON-TB Gold Plus: POSITIVE — AB

## 2020-02-04 ENCOUNTER — Other Ambulatory Visit (INDEPENDENT_AMBULATORY_CARE_PROVIDER_SITE_OTHER): Payer: Self-pay | Admitting: Primary Care

## 2020-02-04 DIAGNOSIS — R7612 Nonspecific reaction to cell mediated immunity measurement of gamma interferon antigen response without active tuberculosis: Secondary | ICD-10-CM

## 2020-02-04 NOTE — Telephone Encounter (Signed)
Pt would like a call back to discuss lab results with clinic

## 2020-02-07 ENCOUNTER — Ambulatory Visit (INDEPENDENT_AMBULATORY_CARE_PROVIDER_SITE_OTHER): Payer: Self-pay | Admitting: Primary Care

## 2020-02-07 NOTE — Telephone Encounter (Signed)
Pt reports mild-moderate dizziness, onset this AM. States BP at 0800 124/82 -  HR 78. States took her amlodipine 10mg , rechecked BP after 1 hour, BP 113/80  HR 70. States dizziness began at that time. Does not recall any other values but states 113/ is low for her.  Reports dizziness mild, mostly at times moderate. Only occurs when "Up and walking." Is not positional, pt does not have to hold onto things or sit back down; states "I just move on and it goes away eventually; I'm always on the move." Denies any spinning. No SOB, no CP. Pt does reports occasional intermittent headache 6/10 when occurs, at temple area. States is staying hydrated. No missed doses of amlodipine. Pt presently at work. Per disposition to be seen within 24 hrs. No availability.  Assured pt NT would route to practice for PCPs review. Advised UC if dizziness, headache worsens. Advised to monitor BP.  Pt verbalizes understanding. Please advise: [phone]  Reason for Disposition . Taking a medicine that could cause dizziness (e.g., blood pressure medications, diuretics)  Answer Assessment - Initial Assessment Questions 1. DESCRIPTION: "Describe your dizziness."     "Woozy" not spinning 2. LIGHTHEADED: "Do you feel lightheaded?" (e.g., somewhat faint, woozy, weak upon standing)     yes 3. VERTIGO: "Do you feel like either you or the room is spinning or tilting?" (i.e. vertigo)     no 4. SEVERITY: "How bad is it?"  "Do you feel like you are going to faint?" "Can you stand and walk?"   - MILD: Feels slightly dizzy, but walking normally.   - MODERATE: Feels very unsteady when walking, but not falling; interferes with normal activities (e.g., school, work) .   - SEVERE: Unable to walk without falling, or requires assistance to walk without falling; feels like passing out now.      Mild-moderate 5. ONSET:  "When did the dizziness begin?"     This AM 6. AGGRAVATING FACTORS: "Does anything make it worse?" (e.g., standing,  change in head position)     walking 7. HEART RATE: "Can you tell me your heart rate?" "How many beats in 15 seconds?"  (Note: not all patients can do this)       70 8. CAUSE: "What do you think is causing the dizziness?"     Unsure, maybe BP 9. RECURRENT SYMPTOM: "Have you had dizziness before?" If Yes, ask: "When was the last time?" "What happened that time?"    no 10. OTHER SYMPTOMS: "Do you have any other symptoms?" (e.g., fever, chest pain, vomiting, diarrhea, bleeding)       Headache  6-7/10: intermittent at temple area  Protocols used: DIZZINESS [facility]

## 2020-02-08 NOTE — Telephone Encounter (Signed)
Patient dropped off forms

## 2020-02-21 ENCOUNTER — Telehealth (INDEPENDENT_AMBULATORY_CARE_PROVIDER_SITE_OTHER): Payer: Self-pay | Admitting: Primary Care

## 2020-02-21 NOTE — Telephone Encounter (Signed)
Patient is calling to let Patty Armstrong know that she dropped off [facility] screening form. Wanting to know if Patty Armstrong can clear her name. [facility] [employer] [facility] is keeps calling her for her name to be cleared. Please advise [phone]

## 2020-02-22 NOTE — Telephone Encounter (Signed)
Please advise 

## 2020-02-22 NOTE — Telephone Encounter (Signed)
Left message informing patient that forms were ready for pick up.

## 2020-03-03 ENCOUNTER — Encounter (INDEPENDENT_AMBULATORY_CARE_PROVIDER_SITE_OTHER): Payer: Self-pay | Admitting: Primary Care

## 2020-03-03 ENCOUNTER — Other Ambulatory Visit: Payer: Self-pay

## 2020-03-03 ENCOUNTER — Ambulatory Visit (INDEPENDENT_AMBULATORY_CARE_PROVIDER_SITE_OTHER): Payer: 59 | Admitting: Primary Care

## 2020-03-03 VITALS — BP 130/80 | HR 85 | Temp 98.1°F | Ht 61.0 in | Wt 157.2 lb

## 2020-03-03 DIAGNOSIS — I10 Essential (primary) hypertension: Secondary | ICD-10-CM | POA: Diagnosis not present

## 2020-03-03 NOTE — Progress Notes (Signed)
Established Patient Office Visit  Subjective:  Patient ID: Patty Armstrong, female    DOB: 10-Mar-1961  Age: 59 y.o. MRN: [id number]  CC:  Chief Complaint  Patient presents with  . Blood Pressure Check    HPI [name] presents for blood pressure follow up and form to be completed. Bp not at goal 144/86 states she is nervous about going to work in [place].   History reviewed. No pertinent past medical history.  History reviewed. No pertinent surgical history.  Family History  Problem Relation Age of Onset  . Diabetes Mother   . Heart disease Mother   . Hypertension Mother     Social History   Socioeconomic History  . Marital status: Married    Spouse name: Not on file  . Number of children: Not on file  . Years of education: Not on file  . Highest education level: Not on file  Occupational History  . Not on file  Tobacco Use  . Smoking status: Never Smoker  . Smokeless tobacco: Never Used  Substance and Sexual Activity  . Alcohol use: No  . Drug use: No  . Sexual activity: Not on file  Other Topics Concern  . Not on file  Social History Narrative  . Not on file   Social Determinants of Health   Financial Resource Strain:   . Difficulty of Paying Living Expenses:   Food Insecurity:   . Worried About Programme researcher, broadcasting/film/video in the Last Year:   . Barista in the Last Year:   Transportation Needs:   . Freight forwarder (Medical):   Marland Kitchen Lack of Transportation (Non-Medical):   Physical Activity:   . Days of Exercise per Week:   . Minutes of Exercise per Session:   Stress:   . Feeling of Stress :   Social Connections:   . Frequency of Communication with Friends and Family:   . Frequency of Social Gatherings with Friends and Family:   . Attends Religious Services:   . Active Member of Clubs or Organizations:   . Attends Banker Meetings:   Marland Kitchen Marital Status:   Intimate Partner Violence:   . Fear of Current or Ex-Partner:   .  Emotionally Abused:   Marland Kitchen Physically Abused:   . Sexually Abused:     Outpatient Medications Prior to Visit  Medication Sig Dispense Refill  . amLODipine (NORVASC) 10 MG tablet Take 1 tablet (10 mg total) by mouth daily. 90 tablet 3   No facility-administered medications prior to visit.    No Known Allergies  ROS Review of Systems  All other systems reviewed and are negative.     Objective:    Physical Exam Vitals reviewed.  Cardiovascular:     Rate and Rhythm: Regular rhythm.  Pulmonary:     Effort: Pulmonary effort is normal.     Breath sounds: Normal breath sounds.  Abdominal:     General: Bowel sounds are normal. There is distension.  Musculoskeletal:        General: Normal range of motion.     Cervical back: Normal range of motion and neck supple.  Skin:    General: Skin is warm and dry.  Neurological:     Mental Status: She is alert and oriented to person, place, and time.  Psychiatric:        Mood and Affect: Mood normal.        Behavior: Behavior normal.  Thought Content: Thought content normal.        Judgment: Judgment normal.     BP 130/80 (BP Location: Left Arm, Cuff Size: Normal)   Pulse 85   Temp 98.1 F (36.7 C) (Oral)   Ht 5\' 1"  (1.549 m)   Wt 157 lb 3.2 oz (71.3 kg)   LMP  (LMP Unknown)   SpO2 100%   BMI 29.70 kg/m  Wt Readings from Last 3 Encounters:  03/03/20 157 lb 3.2 oz (71.3 kg)  01/31/20 161 lb 12.8 oz (73.4 kg)  09/12/19 167 lb 1.6 oz (75.8 kg)     Health Maintenance Due  Topic Date Due  . Hepatitis C Screening  Never done  . COVID-19 Vaccine (1) Never done  . HIV Screening  Never done  . PAP SMEAR-Modifier  Never done  . INFLUENZA VACCINE  02/24/2020    There are no preventive care reminders to display for this patient.  Lab Results  Component Value Date   TSH 2.040 09/12/2019   Lab Results  Component Value Date   WBC 2.3 (L) 04/23/2016   HGB 10.4 (L) 04/23/2016   HCT 31.9 (L) 04/23/2016   MCV 80.8  04/23/2016   PLT 273 04/23/2016   Lab Results  Component Value Date   NA 138 04/23/2016   K 4.0 04/23/2016   CO2 22 04/23/2016   GLUCOSE 115 (H) 04/23/2016   BUN 11 04/23/2016   CREATININE 0.55 04/23/2016   BILITOT 0.3 04/23/2016   ALKPHOS 96 04/23/2016   AST 30 04/23/2016   ALT 20 04/23/2016   PROT 6.4 04/23/2016   ALBUMIN 3.7 04/23/2016   CALCIUM 8.5 (L) 04/23/2016   Lab Results  Component Value Date   CHOL 126 09/12/2019   Lab Results  Component Value Date   HDL 72 09/12/2019   Lab Results  Component Value Date   LDLCALC 39 09/12/2019   Lab Results  Component Value Date   TRIG 73 09/12/2019   Lab Results  Component Value Date   CHOLHDL 1.8 09/12/2019   Lab Results  Component Value Date   HGBA1C 5.9 (A) 01/31/2020      Assessment & Plan:  Essential hypertension [name] this takes her blood pressure at home her systolic  ranged from blood 118-120 and diastolic 78-80 on amlodipine 10mg  daily. Goal is meet <130/80, low-sodium, DASH diet, medication compliance, 150 minutes of moderate intensity exercise per week.  No orders of the defined types were placed in this encounter.   Follow-up: Return when returns from [place] for health care gaps.    , NP

## 2020-09-17 ENCOUNTER — Encounter: Payer: 59 | Admitting: Obstetrics and Gynecology

## 2020-09-18 ENCOUNTER — Encounter: Payer: 59 | Admitting: Obstetrics and Gynecology

## 2020-10-09 ENCOUNTER — Encounter: Payer: Self-pay | Admitting: Obstetrics and Gynecology

## 2021-02-05 IMAGING — MG DIGITAL SCREENING BILAT W/ TOMO W/ CAD
8 series · 8 of 24 positions shown · non-contrast
Comparison: Previous exam(s).

CLINICAL DATA: Screening.

EXAM:
DIGITAL SCREENING BILATERAL MAMMOGRAM WITH TOMO AND CAD

[R CC synth-2D]
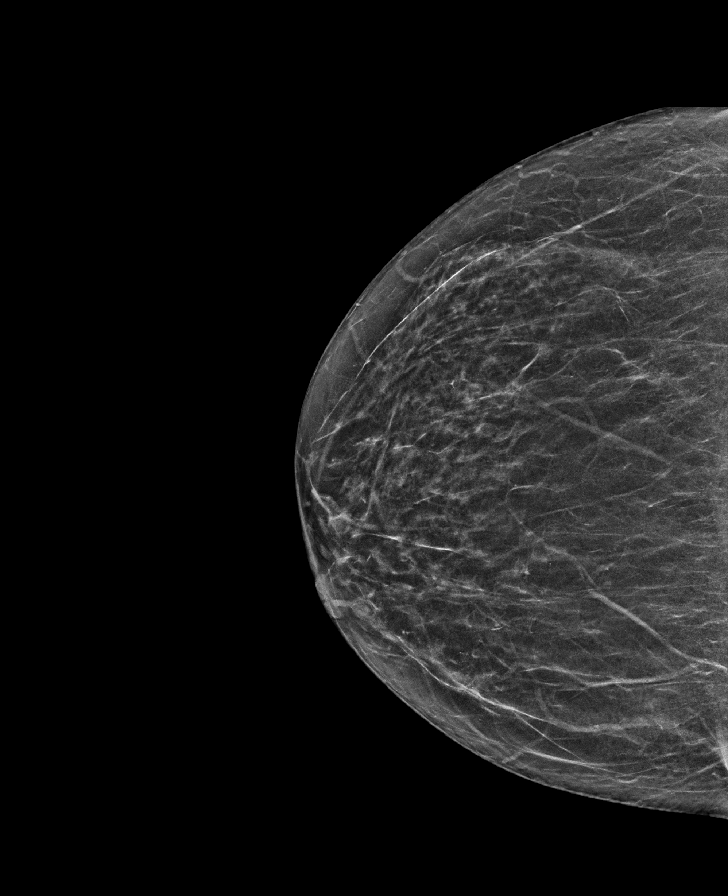

[R MLO synth-2D]
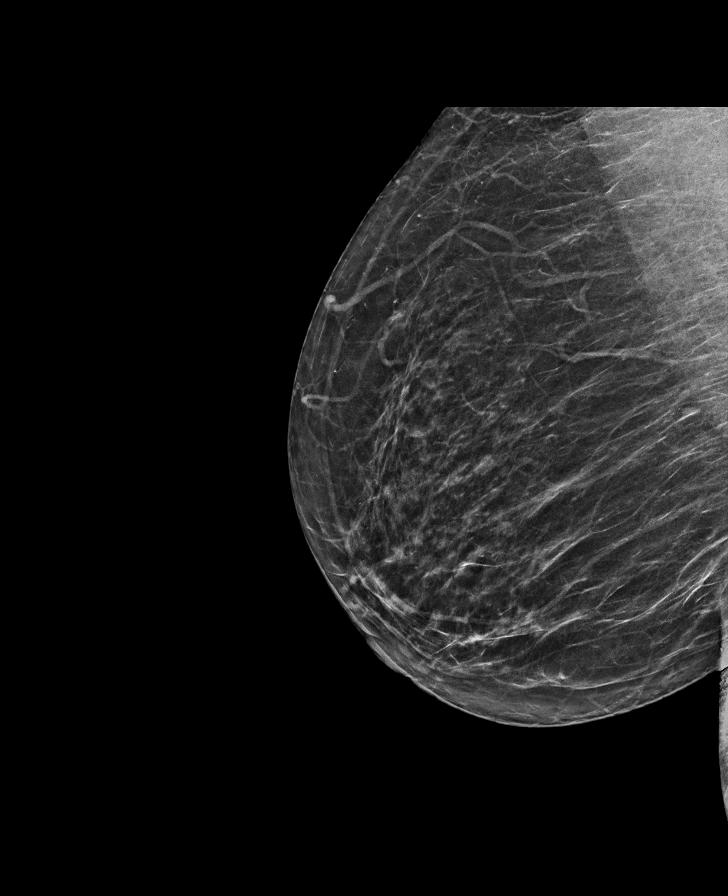

[L CC synth-2D]
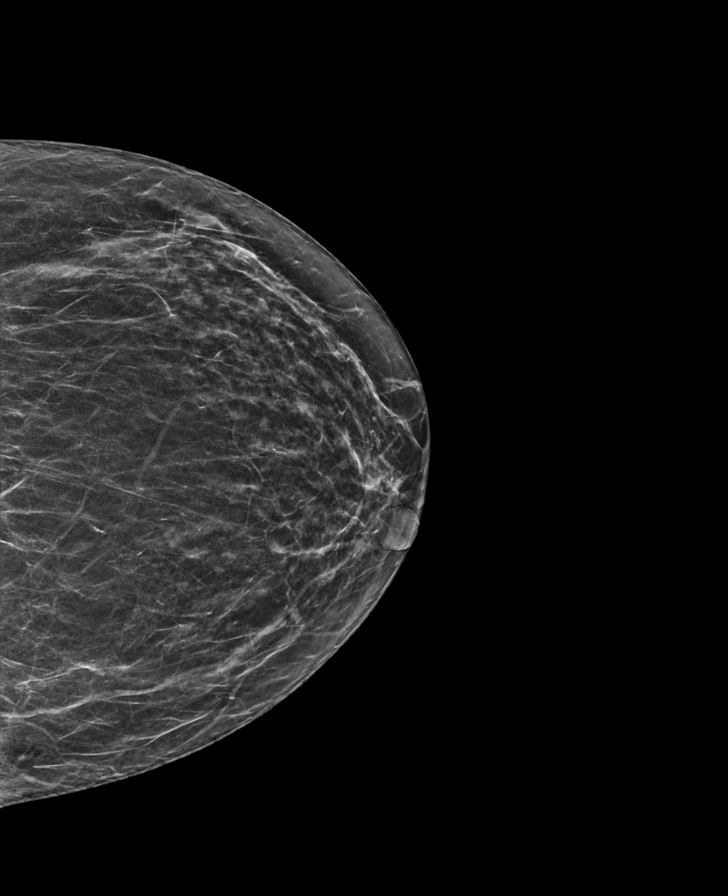

[L MLO synth-2D]
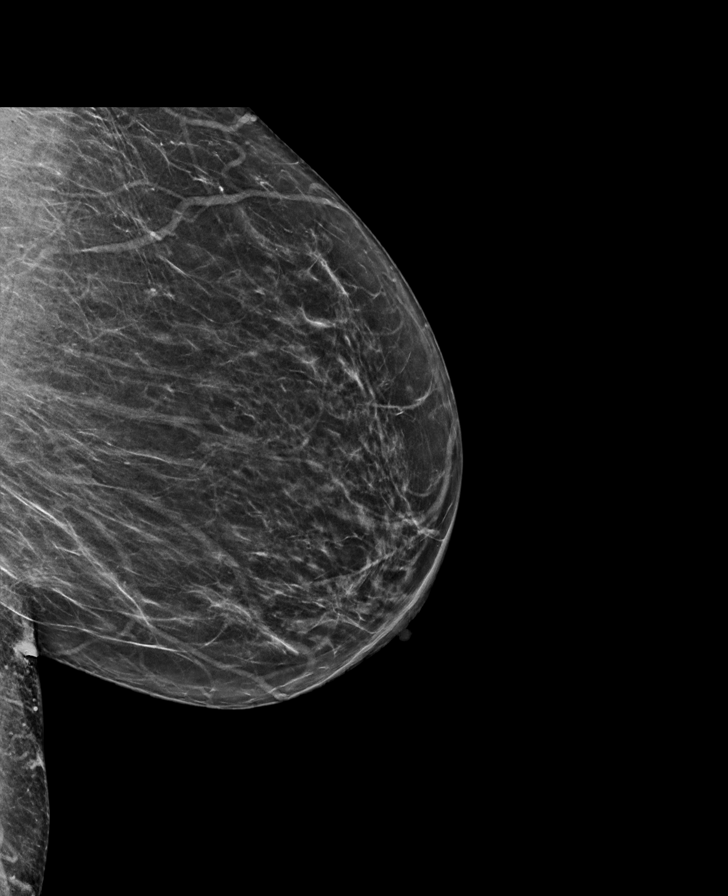

[L MLO tomo · tomo slice 31/60.0]
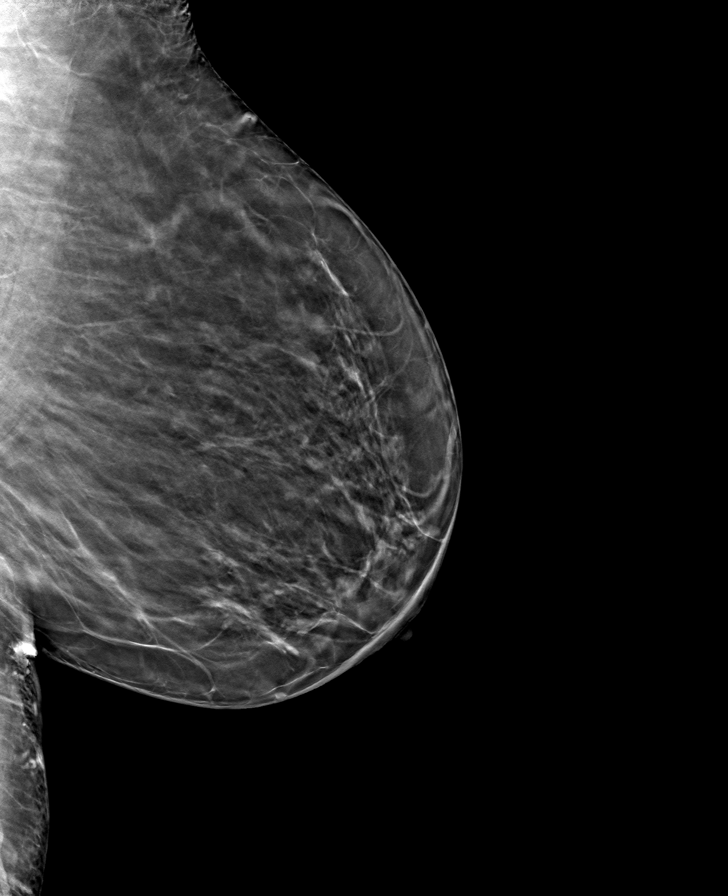

[L CC tomo · tomo slice 27/54.0]
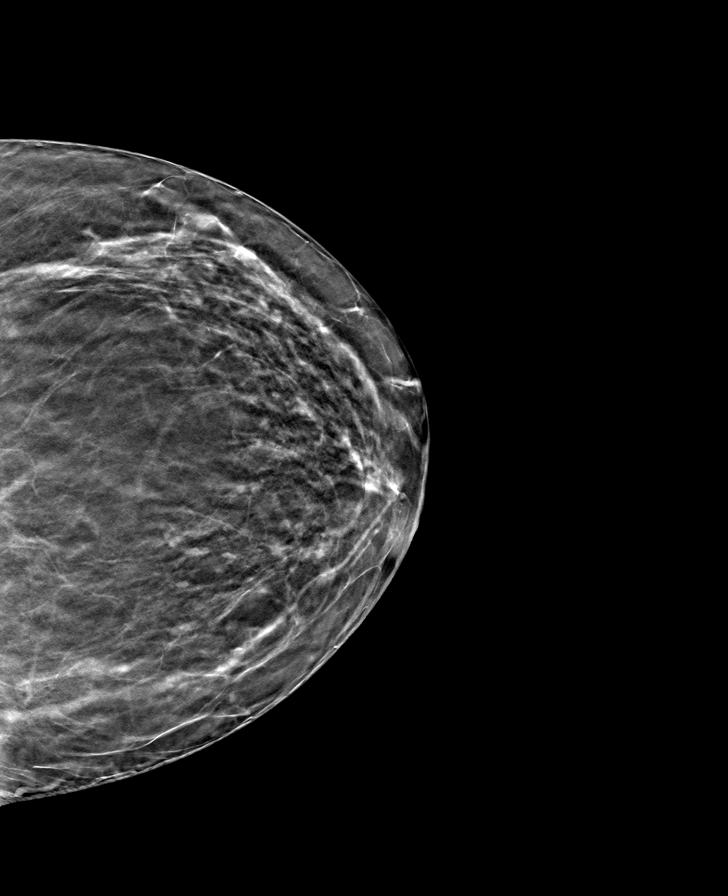

[R MLO tomo · tomo slice 31/60.0]
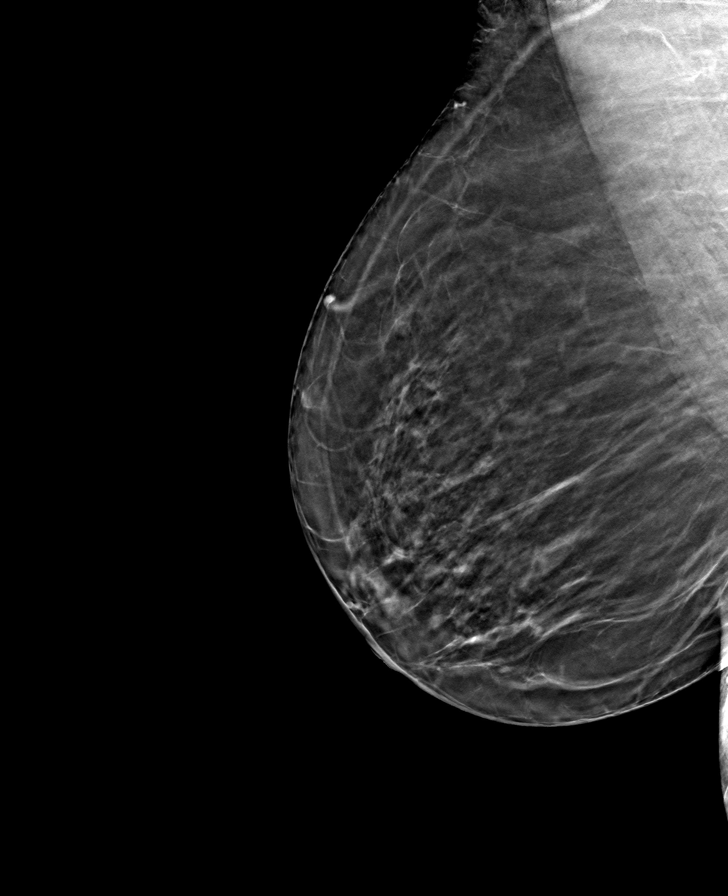

[R CC tomo · tomo slice 29/56.0]
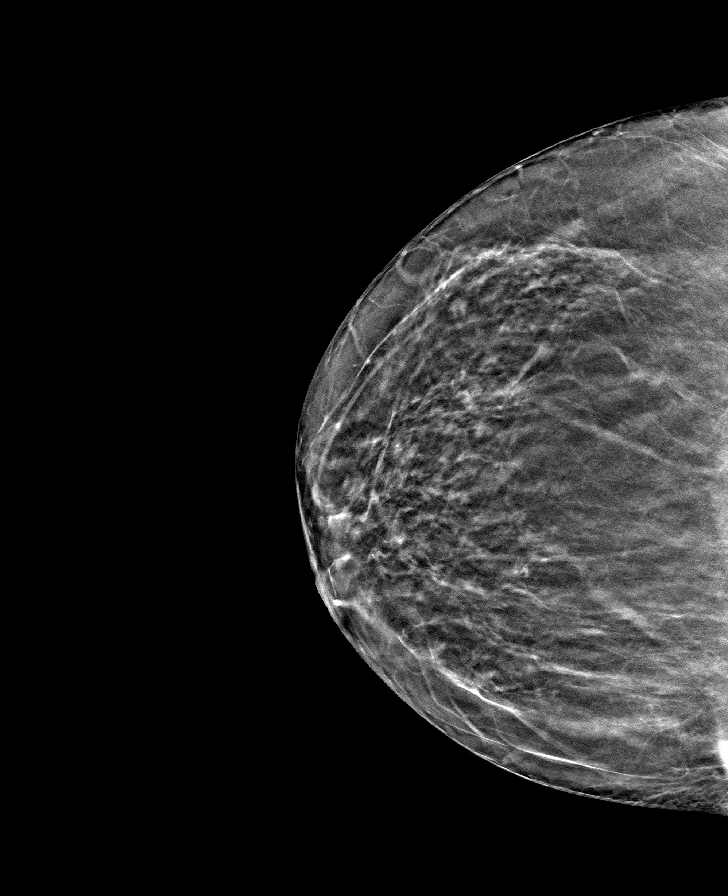

[8 of 24 positions shown; findings below may reference images not displayed]

ACR Breast Density Category b: There are scattered areas of
fibroglandular density.
FINDINGS: There are no findings suspicious for malignancy. Images were
processed with CAD.
IMPRESSION: No mammographic evidence of malignancy. A result letter of this
screening mammogram will be mailed directly to the patient.

RECOMMENDATION:
Screening mammogram in one year. (Code:[id number])

BI-RADS CATEGORY  1: Negative.
